# Patient Record
Sex: Male | Born: 1975 | Race: White | Hispanic: No | Marital: Married | Smoking: Never smoker
Health system: Southern US, Community
[De-identification: ages and names within clinical notes are randomized; demographics above are authoritative.]

## PROBLEM LIST (undated history)

## (undated) DIAGNOSIS — E669 Obesity, unspecified: Secondary | ICD-10-CM

## (undated) DIAGNOSIS — M25569 Pain in unspecified knee: Secondary | ICD-10-CM

## (undated) DIAGNOSIS — G8929 Other chronic pain: Secondary | ICD-10-CM

## (undated) DIAGNOSIS — F32A Depression, unspecified: Secondary | ICD-10-CM

## (undated) DIAGNOSIS — F329 Major depressive disorder, single episode, unspecified: Secondary | ICD-10-CM

## (undated) NOTE — ED Provider Notes (Signed)
 Formatting of this note is different from the original. eMERGENCY dEPARTMENT eNCOUnter    CHIEF COMPLAINT   Chief Complaint  Patient presents with  ? Chest Pain    starting this morning, chest pressure and pain   HPI   Martin Stanley is a 57 y.o. male who presents to the Emergency Department central area chest pain that felt like a pressure but also stabbing that started about 45 minutes prior to arrival.  Patient states he felt nauseous with that he did feel short of breath with it.  He reports no history of the same.  He does report a family history of heart disease including his brother.  He has a history of hyperlipidemia.  Patient was given aspirin  and nitro EN route which took his pain from a 9/10 to a 2/10.  PAST MEDICAL HISTORY   Past Medical History:  Diagnosis Date  ? Arthritis   ? Bipolar 1 disorder (HCC)   ? Hyperlipidemia   ? Sleep apnea    SURGICAL HISTORY   No past surgical history on file.  CURRENT MEDICATIONS   No current facility-administered medications for this encounter.    No current outpatient medications on file.   ALLERGIES   Allergies  Allergen Reactions  ? Adhesive Tape-Silicones Rash   FAMILY HISTORY   No family history on file.  SOCIAL HISTORY   Social History   Socioeconomic History  ? Marital status: Legally Separated    Spouse name: Not on file  ? Number of children: Not on file  ? Years of education: Not on file  ? Highest education level: Not on file  Occupational History  ? Not on file  Social Needs  ? Financial resource strain: Not on file  ? Food insecurity    Worry: Not on file    Inability: Not on file  ? Transportation needs    Medical: Not on file    Non-medical: Not on file  Tobacco Use  ? Smoking status: Former Smoker    Types: Cigarettes    Quit date: 12/28/1993    Years since quitting: 26.0  ? Smokeless tobacco: Never Used  Substance and Sexual Activity  ? Alcohol use: Not on file  ? Drug use: Not on file  ?  Sexual activity: Not on file  Lifestyle  ? Physical activity    Days per week: Not on file    Minutes per session: Not on file  ? Stress: Not on file  Relationships  ? Social Musician on phone: Not on file    Gets together: Not on file    Attends religious service: Not on file    Active member of club or organization: Not on file    Attends meetings of clubs or organizations: Not on file    Relationship status: Not on file  ? Intimate partner violence    Fear of current or ex partner: Not on file    Emotionally abused: Not on file    Physically abused: Not on file    Forced sexual activity: Not on file  Other Topics Concern  ? Not on file  Social History Narrative  ? Not on file   REVIEW OF SYSTEMS   Constitutional:  Denies fever, no chills, no weakness.   Eyes:  No visual complaints HENT:  Denies sore throat, no ear pain.   Respiratory:  Positive for shortness of breath Cardiovascular:  Positive for chest pain GI:  Positive for nausea GU:  Denies any urinary complaints Musculoskeletal:  Denies pain or swelling Back:  Denies pain.   Neck: Denies pain Skin:  Denies rash.   Neurologic:  Patient felt like he was going to pass out  See HPI for further details. A complete review of systems has been obtained and is included in the HPI as above and as per nursing notes. 10 systems have been reviewed and are otherwise negative.  PHYSICAL EXAM    VITAL SIGNS: BP 102/54   Pulse 58   Temp 98 F (36.7 C)   Resp 18   Ht 6' (1.829 m)   Wt (!) 437 lb (198 kg)   SpO2 100%   BMI 59.27 kg/m  Constitutional patient appears comfortable alert alert Head : No appearance of trauma, no raccoon or Battle sign HENT:  Grossly normal with patent airway, no rhinorrhea Eyes:  PERRL, EOMI, conjunctiva normal, no discharge. Neck: Supple painless ROM, trachea midline;  Respiratory:  breath sounds normal, no resp distress No reproducible chest wall tenderness Cardiovascular:   heart sounds nl, regular rate and rhythm. Back:  Non tender, normal painless ROM         GI:   Soft, non-tender, no distention, normal active bowel sounds Musculoskeletal:  atraumatic, no pedal edema, nml ROM,  Skin:  intact, warm dry, no rash Neurologic:  alert and oriented x 3, CN?s nml as tested, sensation nml, motor nml,   RADIOLOGY   Reviewed by me. See official radiology interpretation. X-ray Chest Pa Or Ap  Result Date: 12/29/2019 SINGLE VIEW CHEST: COMPARISON: No comparison. FINDINGS: No focal consolidation.  No pneumothorax or pleural effusion. Normal heart size.   IMPRESSION: NO ACUTE ABNORMALITY. Electronically Signed By: Jeoffrey CHRISTELLA Helling, MD 12/29/2019 15:53 Abnormal Labs Reviewed  CBC W/O DIFFERENTIAL PANEL - Abnormal; Notable for the following components:      Result Value   WBC Count 10.6 (*)    MPV 9.0 (*)    All other components within normal limits   ED COURSE & MEDICAL DECISION MAKING   Pertinent labs & imaging studies reviewed. (See chart for details) The patient had a period time hypotension which I cannot explain readily since the nitroglycerin  had been given pre-hospital but when he got here pressure was normal.    His troponins x2 are negative.  EKG nondiagnostic but with no apparent ischemic changes and I have since imaged him with CT to assess for potential dissection and note no abnormality there either.    Patient notes symptoms resolved says and I feel he is safe for discharge.  His heart score of 2.  He agrees to follow-up with his primary.  My differential includes MI dissection PE esophageal spasm gastric reflux.  FINAL IMPRESSION   Final diagnoses:  Chest pain, unspecified type   EKG interpretation  Portions of this note may be dictated using Dragon Naturally Speaking voice recognition software. Variances in spelling and vocabulary are possible and unintentional. Not all errors are caught/corrected. Please notify the dino if any discrepancies  are noted or if the meaning of any statement is not clear.    Todd C. Juli, MD 12/29/19 1932   Krystal BROCKS. Juli, MD 02/21/20 0308  Electronically signed by Krystal BROCKS. Juli, MD at 12/29/2019  7:32 PM EST Electronically signed by Krystal BROCKS. Juli, MD at 02/21/2020  3:08 AM EDT

---

## 2015-04-08 ENCOUNTER — Emergency Department (HOSPITAL_BASED_OUTPATIENT_CLINIC_OR_DEPARTMENT_OTHER)
Admission: EM | Admit: 2015-04-08 | Discharge: 2015-04-09 | Disposition: A | Discharge status: Home / Self Care | Payer: BLUE CROSS/BLUE SHIELD | Attending: Emergency Medicine | Admitting: Emergency Medicine

## 2015-04-08 ENCOUNTER — Emergency Department (HOSPITAL_BASED_OUTPATIENT_CLINIC_OR_DEPARTMENT_OTHER): Payer: BLUE CROSS/BLUE SHIELD

## 2015-04-08 ENCOUNTER — Encounter (HOSPITAL_BASED_OUTPATIENT_CLINIC_OR_DEPARTMENT_OTHER): Payer: Self-pay | Admitting: *Deleted

## 2015-04-08 DIAGNOSIS — R1013 Epigastric pain: Secondary | ICD-10-CM | POA: Diagnosis not present

## 2015-04-08 DIAGNOSIS — G8929 Other chronic pain: Secondary | ICD-10-CM | POA: Diagnosis not present

## 2015-04-08 DIAGNOSIS — Z880 Allergy status to penicillin: Secondary | ICD-10-CM | POA: Insufficient documentation

## 2015-04-08 DIAGNOSIS — Z8659 Personal history of other mental and behavioral disorders: Secondary | ICD-10-CM | POA: Insufficient documentation

## 2015-04-08 DIAGNOSIS — D72829 Elevated white blood cell count, unspecified: Secondary | ICD-10-CM | POA: Diagnosis not present

## 2015-04-08 DIAGNOSIS — M2550 Pain in unspecified joint: Secondary | ICD-10-CM | POA: Diagnosis not present

## 2015-04-08 DIAGNOSIS — R52 Pain, unspecified: Secondary | ICD-10-CM

## 2015-04-08 DIAGNOSIS — E669 Obesity, unspecified: Secondary | ICD-10-CM | POA: Insufficient documentation

## 2015-04-08 DIAGNOSIS — R079 Chest pain, unspecified: Secondary | ICD-10-CM | POA: Diagnosis present

## 2015-04-08 HISTORY — DX: Depression, unspecified: F32.A

## 2015-04-08 HISTORY — DX: Obesity, unspecified: E66.9

## 2015-04-08 HISTORY — DX: Pain in unspecified knee: M25.569

## 2015-04-08 HISTORY — DX: Other chronic pain: G89.29

## 2015-04-08 HISTORY — DX: Major depressive disorder, single episode, unspecified: F32.9

## 2015-04-08 LAB — BASIC METABOLIC PANEL
Anion gap: 5 (ref 5–15)
BUN: 24 mg/dL — ABNORMAL HIGH (ref 6–20)
CALCIUM: 8.6 mg/dL — AB (ref 8.9–10.3)
CO2: 26 mmol/L (ref 22–32)
CREATININE: 0.81 mg/dL (ref 0.61–1.24)
Chloride: 107 mmol/L (ref 101–111)
GFR calc Af Amer: >60 mL/min (ref >60)
GFR calc non Af Amer: >60 mL/min (ref >60)
Glucose, Bld: 114 mg/dL — ABNORMAL HIGH (ref 65–99)
POTASSIUM: 3.8 mmol/L (ref 3.5–5.1)
SODIUM: 138 mmol/L (ref 135–145)

## 2015-04-08 LAB — HEPATIC FUNCTION PANEL
ALK PHOS: 73 U/L (ref 38–126)
ALT: 22 U/L (ref 17–63)
AST: 19 U/L (ref 15–41)
Albumin: 3.9 g/dL (ref 3.5–5.0)
BILIRUBIN DIRECT: 0.1 mg/dL (ref 0.1–0.5)
Indirect Bilirubin: 0.1 mg/dL — ABNORMAL LOW (ref 0.3–0.9)
TOTAL PROTEIN: 7.4 g/dL (ref 6.5–8.1)
Total Bilirubin: 0.2 mg/dL — ABNORMAL LOW (ref 0.3–1.2)

## 2015-04-08 LAB — CBC
HCT: 45.4 % (ref 39.0–52.0)
Hemoglobin: 15.1 g/dL (ref 13.0–17.0)
MCH: 29 pg (ref 26.0–34.0)
MCHC: 33.3 g/dL (ref 30.0–36.0)
MCV: 87.3 fL (ref 78.0–100.0)
Platelets: 406 10*3/uL — ABNORMAL HIGH (ref 150–400)
RBC: 5.2 MIL/uL (ref 4.22–5.81)
RDW: 14.1 % (ref 11.5–15.5)
WBC: 21.8 10*3/uL — AB (ref 4.0–10.5)

## 2015-04-08 LAB — D-DIMER, QUANTITATIVE: D-Dimer, Quant: <0.27 ug/mL-FEU (ref 0.00–0.48)

## 2015-04-08 LAB — TROPONIN I: Troponin I: <0.03 ng/mL (ref <0.031)

## 2015-04-08 MED ORDER — KETOROLAC TROMETHAMINE 30 MG/ML IJ SOLN
30.0000 mg | Freq: Once | INTRAMUSCULAR | Status: AC
  Administered 2015-04-08: 30 mg via INTRAVENOUS
  Filled 2015-04-08: qty 1

## 2015-04-08 MED ORDER — NITROGLYCERIN 0.4 MG SL SUBL
0.4000 mg | SUBLINGUAL_TABLET | SUBLINGUAL | Status: DC | PRN
  Administered 2015-04-08: 0.4 mg via SUBLINGUAL
  Filled 2015-04-08: qty 1

## 2015-04-08 MED ORDER — ASPIRIN 325 MG PO TABS
325.0000 mg | ORAL_TABLET | ORAL | Status: AC
  Administered 2015-04-08: 325 mg via ORAL
  Filled 2015-04-08: qty 1

## 2015-04-08 MED ORDER — GI COCKTAIL ~~LOC~~
30.0000 mL | Freq: Once | ORAL | Status: AC
  Administered 2015-04-08: 30 mL via ORAL
  Filled 2015-04-08: qty 30

## 2015-04-08 NOTE — ED Provider Notes (Signed)
CSN: [id number]     Arrival date & time 04/08/15  2248 History   This chart was scribed for Martin Bier, MD by Octavia HeirArianna Stanley, ED Scribe. This patient was seen in room MH05/MH05 and the patient's care was started at 11:05 PM.    Chief Complaint  Patient presents with  . Chest Pain     Patient is a 39 y.o. male presenting with abdominal pain. The history is provided by the patient. No language interpreter was used.  Abdominal Pain Pain location:  Epigastric Pain quality: aching   Pain radiates to:  Does not radiate Pain severity:  Severe Onset quality:  Gradual Duration:  6 hours Timing:  Constant Progression:  Unchanged Context: eating   Context comment:  Cheeseburger and french fries Relieved by:  Nothing Worsened by:  Nothing tried Ineffective treatments:  None tried Associated symptoms: no anorexia, no constipation, no cough, no diarrhea, no fever, no shortness of breath and no vomiting   Risk factors: obesity   Risk factors: has not had multiple surgeries    HPI Comments: Shanna CiscoKevin Hosman is a 39 y.o. male who presents to the Emergency Department complaining of a constant, gradual worsening epigastric pain that onset 6 hours ago. He says the pain increases with activity and radiates to his arm and shoulders. Pt reports eating a cheeseburger and french fries about 6 hours ago. Pt states his pain feels like an "ache". Pt denies vomiting, cough, leg swelling, long car trips or plane trips and diarrhea. Pt is allergic to Penicillin.    Past Medical History  Diagnosis Date  . Obesity   . Knee pain, chronic   . Depression    History reviewed. No pertinent past surgical history. No family history on file. History  Substance Use Topics  . Smoking status: Never Smoker   . Smokeless tobacco: Not on file  . Alcohol Use: No    Review of Systems  Constitutional: Negative for fever and diaphoresis.  Respiratory: Negative for cough, chest tightness and shortness of breath.    Gastrointestinal: Positive for abdominal pain. Negative for vomiting, diarrhea, constipation and anorexia.  Musculoskeletal: Positive for arthralgias.  All other systems reviewed and are negative.     Allergies  Penicillins  Home Medications   Prior to Admission medications   Not on File   Triage vitals: BP 132/81 mmHg  Pulse 87  Temp(Src) 97.6 F (36.4 C) (Oral)  Resp 20  Ht 6\' 1"  (1.854 m)  Wt 420 lb (190.511 kg)  BMI 55.42 kg/m2  SpO2 100% Physical Exam  Constitutional: He is oriented to person, place, and time. He appears well-developed and well-nourished. No distress.  HENT:  Head: Normocephalic and atraumatic.  Mouth/Throat: Oropharynx is clear and moist.  Eyes: Conjunctivae and EOM are normal. Pupils are equal, round, and reactive to light.  Neck: Normal range of motion. Neck supple.  Cardiovascular: Normal rate, regular rhythm and intact distal pulses.   Pulmonary/Chest: Effort normal and breath sounds normal. No stridor. No respiratory distress. He has no wheezes. He has no rales. He exhibits no tenderness.  Lungs are clear  Abdominal: Soft. He exhibits no mass. Bowel sounds are increased. There is no tenderness. There is no rigidity, no rebound, no guarding, no tenderness at McBurney's point and negative Murphy's sign.  Hyperactive bowel sounds throughout  Musculoskeletal: Normal range of motion. He exhibits no edema or tenderness.  Neurological: He is alert and oriented to person, place, and time. He has normal reflexes.  Skin: Skin  is warm and dry. He is not diaphoretic.  Psychiatric: He has a normal mood and affect.  Nursing note and vitals reviewed.   ED Course  Procedures  DIAGNOSTIC STUDIES: Oxygen Saturation is 100% on RA, normal by my interpretation.  COORDINATION OF CARE:  11:08 PM Discussed treatment plan which includes chest xray with pt at bedside and pt agreed to plan.  Labs Review Labs Reviewed  TROPONIN I  BASIC METABOLIC PANEL  CBC     Imaging Review No results found.   EKG Interpretation None      MDM   Final diagnoses:  None     Date: 04/09/2015  Rate: 89  Rhythm: normal sinus rhythm  QRS Axis: normal  Intervals: normal  ST/T Wave abnormalities: normal  Conduction Disutrbances: none  Narrative Interpretation: unremarkable  Heart score 1.  2 negative troponins 6 hours apart, ruling out for ACS today.  Ruled out for PE. No other intraabdominal pathology.  Suspect biliary colic with elevated white count.  Will schedule for outpatient ultrasound to exclude gall bladder etiology.  Bland diet.  Close follow up with your PMD.  Strict return precautions given   I personally performed the services described in this documentation, which was scribed in my presence. The recorded information has been reviewed and is accurate.     Cy Blamer, MD 04/09/15 [phone]

## 2015-04-08 NOTE — ED Notes (Signed)
Pt here with left sided CP that began around 7:30pm.  Pain increases with activity and pain radiates to arm and shoulder with activity.  Pt descirbes weakness with this but denies any sob or n/v diaphoresis.  Pt morbidly obese

## 2015-04-09 ENCOUNTER — Emergency Department (HOSPITAL_BASED_OUTPATIENT_CLINIC_OR_DEPARTMENT_OTHER): Payer: BLUE CROSS/BLUE SHIELD

## 2015-04-09 ENCOUNTER — Ambulatory Visit (HOSPITAL_BASED_OUTPATIENT_CLINIC_OR_DEPARTMENT_OTHER)
Admission: RE | Admit: 2015-04-09 | Discharge: 2015-04-09 | Disposition: A | Discharge status: Home / Self Care | Payer: BLUE CROSS/BLUE SHIELD | Source: Ambulatory Visit | Attending: Emergency Medicine | Admitting: Emergency Medicine

## 2015-04-09 ENCOUNTER — Other Ambulatory Visit (HOSPITAL_BASED_OUTPATIENT_CLINIC_OR_DEPARTMENT_OTHER): Payer: Self-pay | Admitting: Emergency Medicine

## 2015-04-09 ENCOUNTER — Ambulatory Visit (HOSPITAL_BASED_OUTPATIENT_CLINIC_OR_DEPARTMENT_OTHER): Admit: 2015-04-09 | Payer: BLUE CROSS/BLUE SHIELD

## 2015-04-09 ENCOUNTER — Encounter (HOSPITAL_BASED_OUTPATIENT_CLINIC_OR_DEPARTMENT_OTHER): Payer: Self-pay | Admitting: Emergency Medicine

## 2015-04-09 DIAGNOSIS — R1084 Generalized abdominal pain: Secondary | ICD-10-CM

## 2015-04-09 LAB — LIPASE, BLOOD: LIPASE: 49 U/L (ref 22–51)

## 2015-04-09 LAB — TROPONIN I

## 2015-04-09 MED ORDER — IOHEXOL 300 MG/ML  SOLN
100.0000 mL | Freq: Once | INTRAMUSCULAR | Status: AC | PRN
  Administered 2015-04-09: 100 mL via INTRAVENOUS

## 2015-04-09 MED ORDER — OMEPRAZOLE 20 MG PO CPDR: 20.0000 mg | DELAYED_RELEASE_CAPSULE | Freq: Every day | ORAL | Status: AC

## 2015-04-09 MED ORDER — OXYCODONE-ACETAMINOPHEN 5-325 MG PO TABS: 1.0000 | ORAL_TABLET | Freq: Four times a day (QID) | ORAL | Status: AC | PRN

## 2015-04-09 MED ORDER — MORPHINE SULFATE 4 MG/ML IJ SOLN
4.0000 mg | Freq: Once | INTRAMUSCULAR | Status: AC
  Administered 2015-04-09: 4 mg via INTRAVENOUS
  Filled 2015-04-09: qty 1

## 2016-03-20 IMAGING — CT CT ABD-PELV W/ CM
2 of 4 series · 17 of 46 positions shown, 19 images · IV contrast (omnipaque)
Comparison: None.

CLINICAL DATA: Gradually worsening abdominal and chest pain.

EXAM:
CT ABDOMEN AND PELVIS WITH CONTRAST
TECHNIQUE: Multidetector CT imaging of the abdomen and pelvis was performed
using the standard protocol following bolus administration of
intravenous contrast.
CONTRAST:  100mL OMNIPAQUE IOHEXOL 300 MG/ML  SOLN

[Series 2: abd/pelvis 5.0 b31f · axial · 0.98mm/px · z∈[+596,+1066]mm · 14 of 104 slices shown, 16 images]
[im 5/104  soft-tissue]
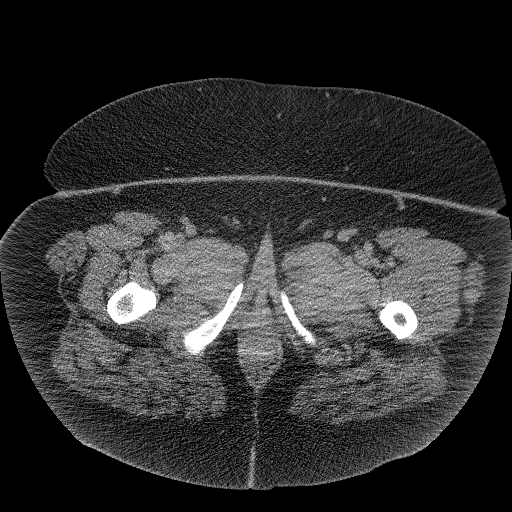
[im 5/104  bone]
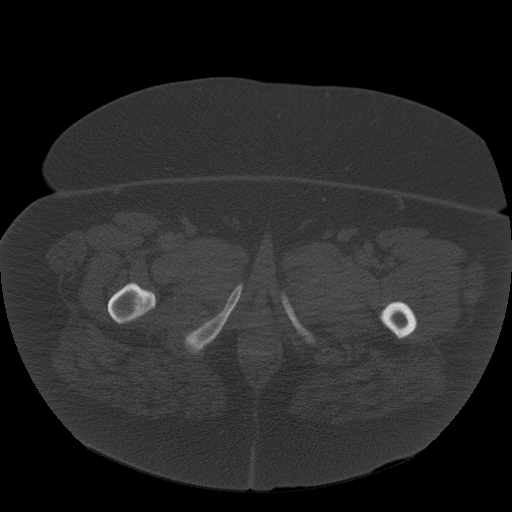
[im 14/104  soft-tissue]
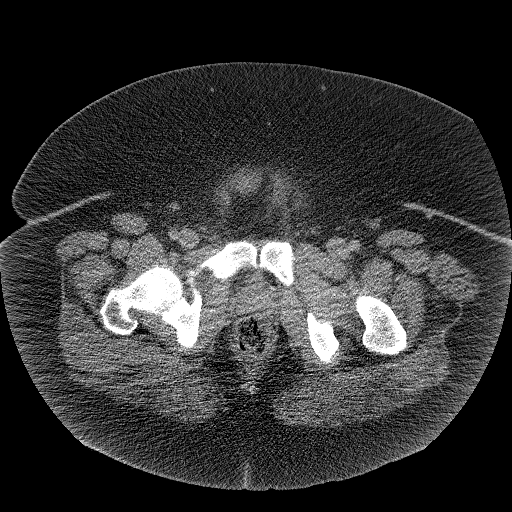
[im 18/104  soft-tissue]
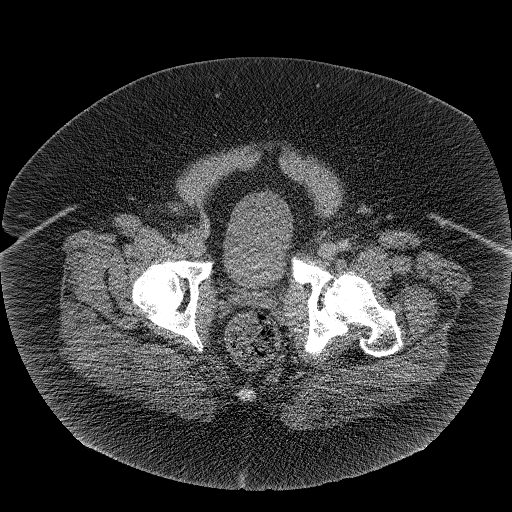
[im 27/104  soft-tissue]
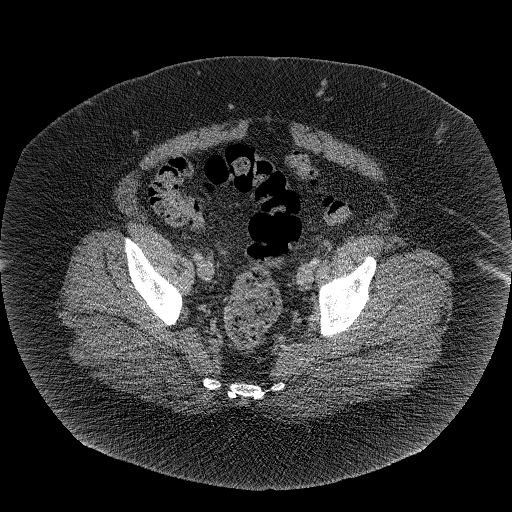
[im 36/104  soft-tissue]
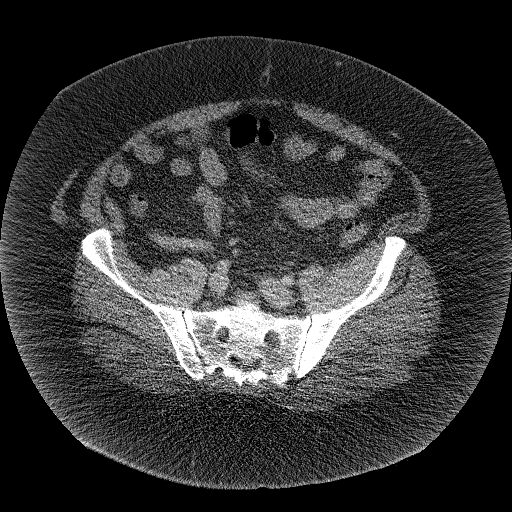
[im 41/104  soft-tissue]
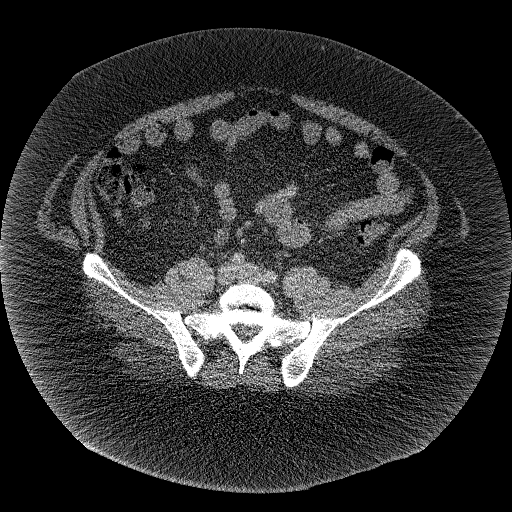
[im 50/104  soft-tissue]
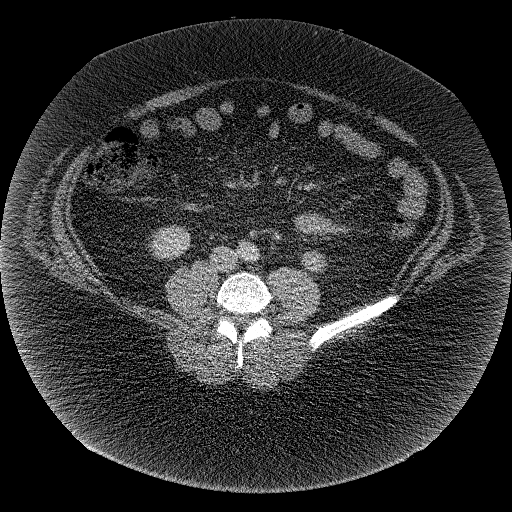
[im 54/104  soft-tissue]
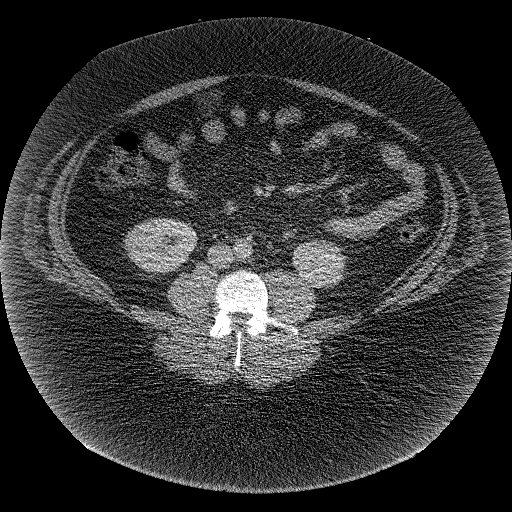
[im 63/104  soft-tissue]
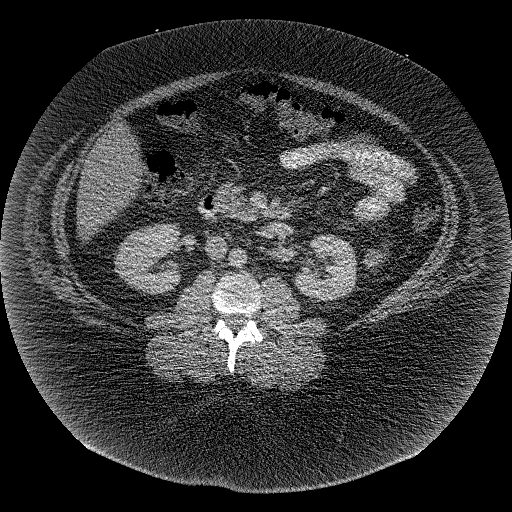
[im 63/104  bone]
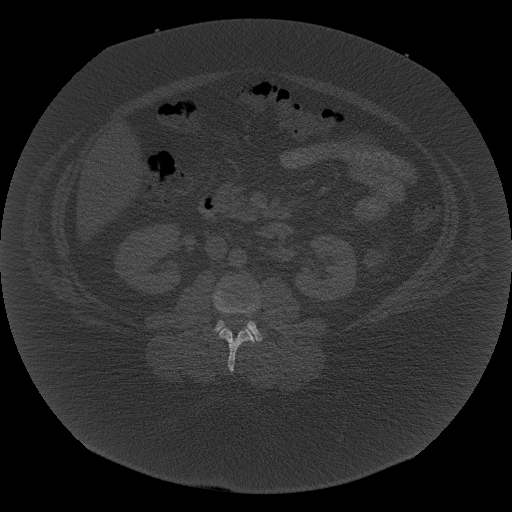
[im 68/104  soft-tissue]
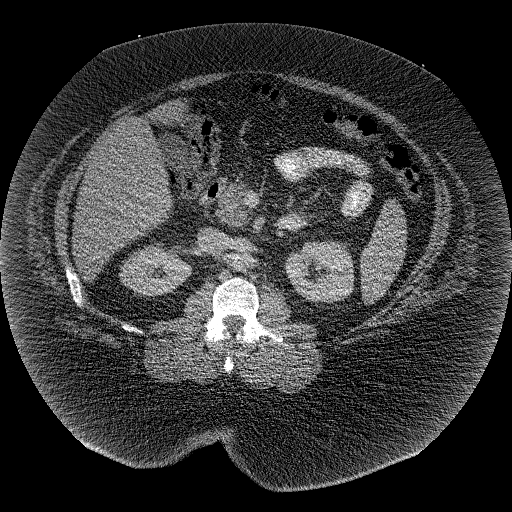
[im 77/104  soft-tissue]
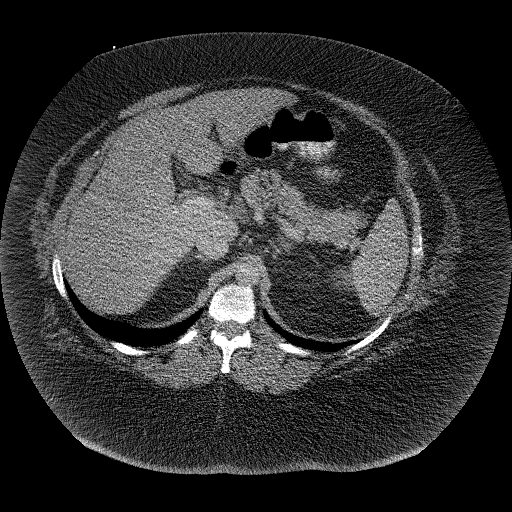
[im 86/104  soft-tissue]
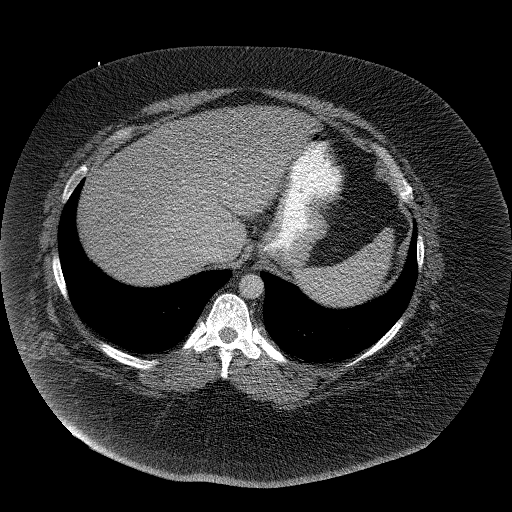
[im 90/104  soft-tissue]
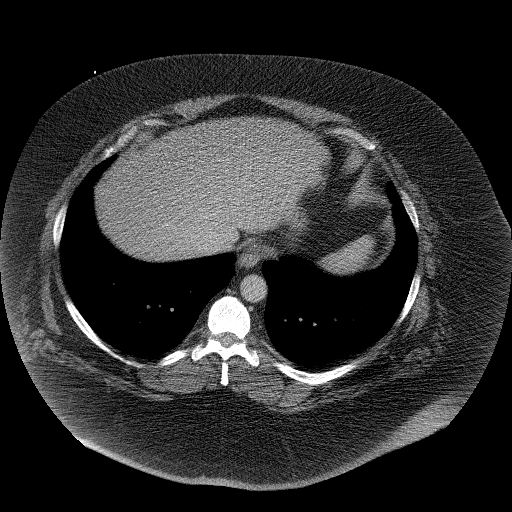
[im 99/104  soft-tissue]
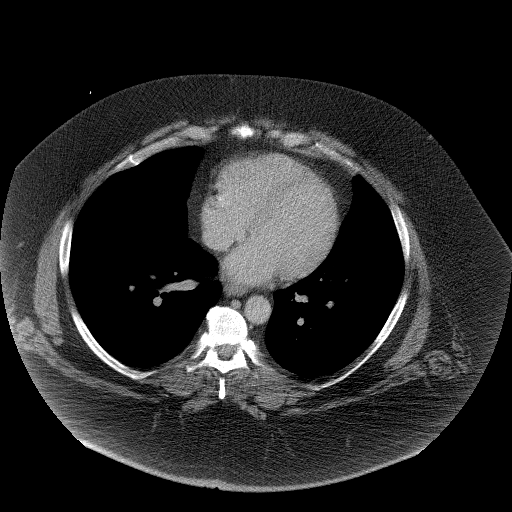

[Series 5: abd/pelvis 3.0 coronal · coronal · 1.06mm/px · 3 of 138 slices shown]
[im 46/138  soft-tissue]
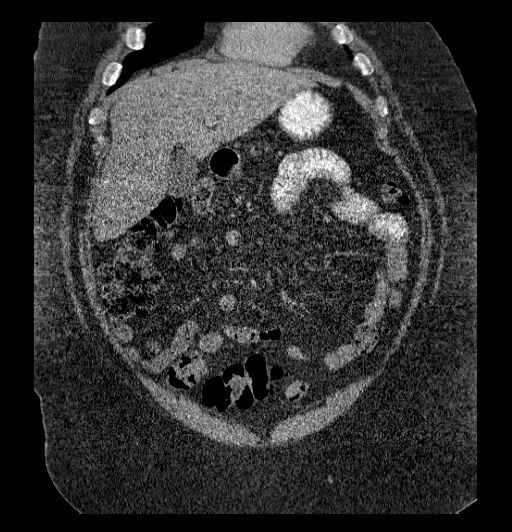
[im 61/138  soft-tissue]
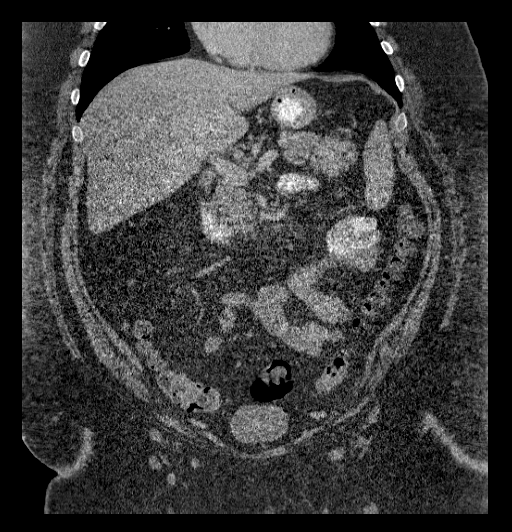
[im 77/138  soft-tissue]
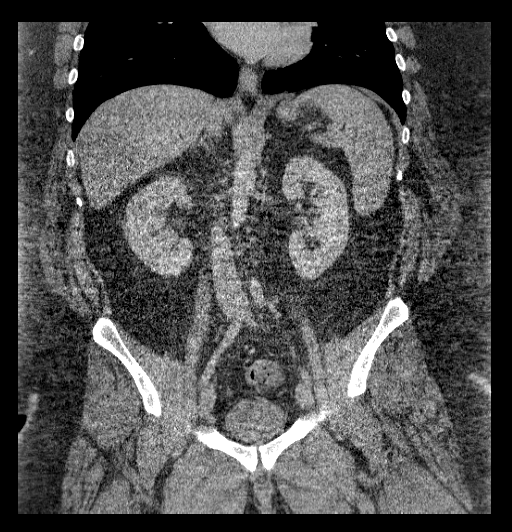

[17 of 46 positions shown; findings below may reference images not displayed]

FINDINGS: The included lung bases are clear. The liver is enlarged with
hepatic steatosis. Gallbladder is physiologically distended. Spleen,
pancreas, and adrenal glands are normal. Kidneys demonstrate
symmetric enhancement without hydronephrosis.

The stomach is physiologically distended. There are no dilated or
thickened bowel loops. The appendix is normal. Moderate stool
throughout the colon without colonic wall thickening. No free air,
free fluid, or intra-abdominal fluid collection.

No retroperitoneal adenopathy. Abdominal aorta is normal in caliber.
Small fat containing umbilical hernia.

Within the pelvis the bladder is physiologically distended. There is
no pelvic free fluid.

There is degenerative disc disease at L5-S1 with vacuum phenomenon.

Image quality is degraded by soft tissue attenuation from body
habitus.
IMPRESSION: 1. No acute abnormality in the abdomen/pelvis.
2. Hepatomegaly and hepatic steatosis.

## 2019-07-12 ENCOUNTER — Other Ambulatory Visit: Payer: Self-pay

## 2019-07-12 ENCOUNTER — Ambulatory Visit (INDEPENDENT_AMBULATORY_CARE_PROVIDER_SITE_OTHER): Payer: 59 | Admitting: Podiatry

## 2019-07-12 DIAGNOSIS — L600 Ingrowing nail: Secondary | ICD-10-CM | POA: Diagnosis not present

## 2019-07-12 MED ORDER — NEOMYCIN-POLYMYXIN-HC 3.5-10000-1 OT SOLN: OTIC | 1 refills | Status: AC

## 2019-07-12 NOTE — Patient Instructions (Signed)

## 2019-07-15 ENCOUNTER — Telehealth: Payer: Self-pay | Admitting: *Deleted

## 2019-07-15 NOTE — Telephone Encounter (Signed)
Ivin Poot, CMA states this occurs on occasion, and Dr. Paulla Dolly recommends continue the epsom salt soaks which will help open the area for the blood to drain and to begin to heal from the inside out and apply the drops, but if had increase redness, swelling or drainage to call the office on the on-call number or 1st thing Monday to schedule to be seen. I left message informing pt of the recommendations.

## 2019-07-15 NOTE — Telephone Encounter (Signed)
Pt states he had an ingrown toenail procedure and now has a blood blister at the edge of the site the nail was removed.

## 2019-07-16 NOTE — Progress Notes (Signed)
Subjective:   Patient ID: Martin Stanley, male   DOB: 43 y.o.   MRN: [id number]   HPI Patient states has a very painful ingrown toenail right big toe that is been there around a month lateral border.  Patient states he is tried to trim and soak it without relief of symptoms and it is become gradually more painful and patient does not smoke likes to be active   Review of Systems  All other systems reviewed and are negative.       Objective:  Physical Exam Vitals signs and nursing note reviewed.  Constitutional:      Appearance: He is well-developed.  Pulmonary:     Effort: Pulmonary effort is normal.  Musculoskeletal: Normal range of motion.  Skin:    General: Skin is warm.  Neurological:     Mental Status: He is alert.     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  I infiltrated the right hallux 60 mg like Marcaine mixture sterile prep applied to the toe and using sterile instrumentation I removed the lateral border and exposed matrix applied phenol 3 applications 30 seconds followed by alcohol lavage sterile dressing.  Gave patient gave instructions on soaks and will be seen back for [place] to recheck

## 2019-07-25 ENCOUNTER — Ambulatory Visit (HOSPITAL_COMMUNITY): Payer: 59 | Admitting: Psychiatry

## 2019-07-25 ENCOUNTER — Encounter: Payer: Self-pay | Admitting: Podiatry

## 2019-08-04 ENCOUNTER — Ambulatory Visit (HOSPITAL_COMMUNITY): Payer: 59 | Admitting: Psychiatry
